# Patient Record
Sex: Male | Born: 1990 | Race: Black or African American | Hispanic: No | Marital: Single | Smoking: Current some day smoker
Health system: Southern US, Community
[De-identification: ages and names within clinical notes are randomized; demographics above are authoritative.]

## PROBLEM LIST (undated history)

## (undated) DIAGNOSIS — F12188 Cannabis abuse with other cannabis-induced disorder: Secondary | ICD-10-CM

## (undated) DIAGNOSIS — F1210 Cannabis abuse, uncomplicated: Secondary | ICD-10-CM

## (undated) DIAGNOSIS — J45909 Unspecified asthma, uncomplicated: Secondary | ICD-10-CM

## (undated) DIAGNOSIS — W3400XA Accidental discharge from unspecified firearms or gun, initial encounter: Secondary | ICD-10-CM

## (undated) DIAGNOSIS — Y249XXA Unspecified firearm discharge, undetermined intent, initial encounter: Secondary | ICD-10-CM

---
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
## 2008-01-19 ENCOUNTER — Emergency Department (HOSPITAL_COMMUNITY): Admission: EM | Admit: 2008-01-19 | Discharge: 2008-01-19 | Payer: Self-pay | Admitting: Emergency Medicine

## 2008-10-21 ENCOUNTER — Emergency Department (HOSPITAL_COMMUNITY): Admission: EM | Admit: 2008-10-21 | Discharge: 2008-10-21 | Payer: Self-pay | Admitting: Emergency Medicine

## 2010-01-23 ENCOUNTER — Emergency Department (HOSPITAL_COMMUNITY): Admission: EM | Admit: 2010-01-23 | Discharge: 2010-01-23 | Payer: Self-pay | Admitting: Emergency Medicine

## 2010-05-05 ENCOUNTER — Emergency Department (HOSPITAL_COMMUNITY): Admission: EM | Admit: 2010-05-05 | Discharge: 2010-05-05 | Payer: Self-pay | Admitting: Emergency Medicine

## 2011-01-26 LAB — GC/CHLAMYDIA PROBE AMP, GENITAL: Chlamydia, DNA Probe: NEGATIVE

## 2011-02-03 LAB — GC/CHLAMYDIA PROBE AMP, GENITAL: Chlamydia, DNA Probe: POSITIVE — AB

## 2011-08-15 LAB — GC/CHLAMYDIA PROBE AMP, GENITAL
Chlamydia, DNA Probe: NEGATIVE
GC Probe Amp, Genital: NEGATIVE

## 2011-08-15 LAB — RPR: RPR Ser Ql: NONREACTIVE

## 2016-10-17 ENCOUNTER — Emergency Department (HOSPITAL_COMMUNITY)
Admission: EM | Admit: 2016-10-17 | Discharge: 2016-10-17 | Disposition: A | Discharge status: Home / Self Care | Payer: Self-pay | Attending: Emergency Medicine | Admitting: Emergency Medicine

## 2016-10-17 ENCOUNTER — Emergency Department (HOSPITAL_COMMUNITY): Payer: Self-pay

## 2016-10-17 ENCOUNTER — Encounter (HOSPITAL_COMMUNITY): Payer: Self-pay

## 2016-10-17 DIAGNOSIS — S41002A Unspecified open wound of left shoulder, initial encounter: Secondary | ICD-10-CM | POA: Insufficient documentation

## 2016-10-17 DIAGNOSIS — J45909 Unspecified asthma, uncomplicated: Secondary | ICD-10-CM | POA: Insufficient documentation

## 2016-10-17 DIAGNOSIS — Y939 Activity, unspecified: Secondary | ICD-10-CM | POA: Insufficient documentation

## 2016-10-17 DIAGNOSIS — F17210 Nicotine dependence, cigarettes, uncomplicated: Secondary | ICD-10-CM | POA: Insufficient documentation

## 2016-10-17 DIAGNOSIS — Y999 Unspecified external cause status: Secondary | ICD-10-CM | POA: Insufficient documentation

## 2016-10-17 DIAGNOSIS — Y929 Unspecified place or not applicable: Secondary | ICD-10-CM | POA: Insufficient documentation

## 2016-10-17 DIAGNOSIS — W3400XA Accidental discharge from unspecified firearms or gun, initial encounter: Secondary | ICD-10-CM | POA: Insufficient documentation

## 2016-10-17 HISTORY — DX: Unspecified asthma, uncomplicated: J45.909

## 2016-10-17 LAB — PREPARE FRESH FROZEN PLASMA
BLOOD PRODUCT EXPIRATION DATE: 201712122359
Blood Product Expiration Date: 201712122359
ISSUE DATE / TIME: 201712081041
ISSUE DATE / TIME: 201712081041
UNIT TYPE AND RH: 6200
Unit Type and Rh: 6200

## 2016-10-17 MED ORDER — HYDROCODONE-ACETAMINOPHEN 5-325 MG PO TABS
1.0000 | ORAL_TABLET | Freq: Once | ORAL | Status: AC
Start: 2016-10-17 — End: 2016-10-17
  Administered 2016-10-17: 1 via ORAL
  Filled 2016-10-17: qty 1

## 2016-10-17 MED ORDER — CEPHALEXIN 500 MG PO CAPS: 500.0000 mg | ORAL_CAPSULE | Freq: Three times a day (TID) | ORAL | 0 refills | Status: DC

## 2016-10-17 MED ORDER — CEPHALEXIN 250 MG PO CAPS
500.0000 mg | ORAL_CAPSULE | Freq: Once | ORAL | Status: AC
  Administered 2016-10-17: 500 mg via ORAL
  Filled 2016-10-17: qty 2

## 2016-10-17 NOTE — Discharge Instructions (Signed)
Change bandage daily with wet to dry dressing. Wear the sling as needed for comfort. Take the antibiotic as prescribed. Call Dr.Xu's office today or Monday, 10/20/2016 to schedule an appointment for one week from today. Tell office staff that Dr.Maverick Dieudonne spoke with Dr.Xu about your case
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
## 2016-10-17 NOTE — Progress Notes (Signed)
Orthopedic Tech Progress Note Patient Details:  Oneida AlarDeionta M XXXBailey 01/18/1991 [id number]  Ortho Devices Type of Ortho Device: Sling immobilizer   Saul FordyceJennifer C Bobby Barton 10/17/2016, 11:31 AM
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
## 2019-06-01 ENCOUNTER — Emergency Department (HOSPITAL_BASED_OUTPATIENT_CLINIC_OR_DEPARTMENT_OTHER): Payer: Self-pay

## 2019-06-01 ENCOUNTER — Other Ambulatory Visit: Payer: Self-pay

## 2019-06-01 ENCOUNTER — Encounter (HOSPITAL_BASED_OUTPATIENT_CLINIC_OR_DEPARTMENT_OTHER): Payer: Self-pay | Admitting: Emergency Medicine

## 2019-06-01 ENCOUNTER — Emergency Department (HOSPITAL_BASED_OUTPATIENT_CLINIC_OR_DEPARTMENT_OTHER)
Admission: EM | Admit: 2019-06-01 | Discharge: 2019-06-01 | Disposition: A | Discharge status: Home / Self Care | Payer: Self-pay | Attending: Emergency Medicine | Admitting: Emergency Medicine

## 2019-06-01 DIAGNOSIS — T407X1A Poisoning by cannabis (derivatives), accidental (unintentional), initial encounter: Secondary | ICD-10-CM | POA: Insufficient documentation

## 2019-06-01 DIAGNOSIS — J45909 Unspecified asthma, uncomplicated: Secondary | ICD-10-CM | POA: Insufficient documentation

## 2019-06-01 DIAGNOSIS — R112 Nausea with vomiting, unspecified: Secondary | ICD-10-CM | POA: Insufficient documentation

## 2019-06-01 DIAGNOSIS — F12188 Cannabis abuse with other cannabis-induced disorder: Secondary | ICD-10-CM

## 2019-06-01 DIAGNOSIS — F17210 Nicotine dependence, cigarettes, uncomplicated: Secondary | ICD-10-CM | POA: Insufficient documentation

## 2019-06-01 LAB — COMPREHENSIVE METABOLIC PANEL
ALT: 20 U/L (ref 0–44)
AST: 18 U/L (ref 15–41)
Albumin: 4.8 g/dL (ref 3.5–5.0)
Alkaline Phosphatase: 41 U/L (ref 38–126)
Anion gap: 13 (ref 5–15)
BUN: 18 mg/dL (ref 6–20)
CO2: 25 mmol/L (ref 22–32)
Calcium: 10.1 mg/dL (ref 8.9–10.3)
Chloride: 100 mmol/L (ref 98–111)
Creatinine, Ser: 1.22 mg/dL (ref 0.61–1.24)
GFR calc Af Amer: >60 mL/min (ref >60)
GFR calc non Af Amer: >60 mL/min (ref >60)
Glucose, Bld: 123 mg/dL — ABNORMAL HIGH (ref 70–99)
Potassium: 3.6 mmol/L (ref 3.5–5.1)
Sodium: 138 mmol/L (ref 135–145)
Total Bilirubin: 1.4 mg/dL — ABNORMAL HIGH (ref 0.3–1.2)
Total Protein: 8.5 g/dL — ABNORMAL HIGH (ref 6.5–8.1)

## 2019-06-01 MED ORDER — SODIUM CHLORIDE 0.9 % IV BOLUS
500.0000 mL | Freq: Once | INTRAVENOUS | Status: AC
  Administered 2019-06-01: 06:00:00 500 mL via INTRAVENOUS

## 2019-06-01 MED ORDER — ONDANSETRON 8 MG PO TBDP: ORAL_TABLET | ORAL | 0 refills | Status: DC

## 2019-06-01 MED ORDER — KETOROLAC TROMETHAMINE 30 MG/ML IJ SOLN
15.0000 mg | Freq: Once | INTRAMUSCULAR | Status: AC
Start: 2019-06-01 — End: 2019-06-01
  Administered 2019-06-01: 15 mg via INTRAVENOUS
  Filled 2019-06-01: qty 1

## 2019-06-01 MED ORDER — HALOPERIDOL LACTATE 5 MG/ML IJ SOLN
5.0000 mg | Freq: Once | INTRAMUSCULAR | Status: AC
  Administered 2019-06-01: 5 mg via INTRAVENOUS
  Filled 2019-06-01: qty 1

## 2019-06-01 MED ORDER — ALUM & MAG HYDROXIDE-SIMETH 200-200-20 MG/5ML PO SUSP
30.0000 mL | Freq: Once | ORAL | Status: AC
  Administered 2019-06-01: 07:00:00 30 mL via ORAL
  Filled 2019-06-01: qty 30
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
## 2019-06-01 NOTE — ED Triage Notes (Signed)
Patient arrived via POV c/o nausea and vomiting x 3 days. Patient states he also feels bloated with no bowel movement since Monday. Patient is AO x 4, normal gait, VS WDL.

## 2019-06-08 ENCOUNTER — Emergency Department (HOSPITAL_BASED_OUTPATIENT_CLINIC_OR_DEPARTMENT_OTHER): Payer: Self-pay

## 2019-06-08 ENCOUNTER — Other Ambulatory Visit: Payer: Self-pay

## 2019-06-08 ENCOUNTER — Emergency Department (HOSPITAL_BASED_OUTPATIENT_CLINIC_OR_DEPARTMENT_OTHER)
Admission: EM | Admit: 2019-06-08 | Discharge: 2019-06-09 | Disposition: A | Discharge status: Home / Self Care | Payer: Self-pay | Attending: Emergency Medicine | Admitting: Emergency Medicine

## 2019-06-08 ENCOUNTER — Encounter (HOSPITAL_BASED_OUTPATIENT_CLINIC_OR_DEPARTMENT_OTHER): Payer: Self-pay

## 2019-06-08 DIAGNOSIS — F17210 Nicotine dependence, cigarettes, uncomplicated: Secondary | ICD-10-CM | POA: Insufficient documentation

## 2019-06-08 DIAGNOSIS — R140 Abdominal distension (gaseous): Secondary | ICD-10-CM | POA: Insufficient documentation

## 2019-06-08 DIAGNOSIS — J45909 Unspecified asthma, uncomplicated: Secondary | ICD-10-CM | POA: Insufficient documentation

## 2019-06-08 DIAGNOSIS — K5900 Constipation, unspecified: Secondary | ICD-10-CM | POA: Insufficient documentation

## 2019-06-08 DIAGNOSIS — K2970 Gastritis, unspecified, without bleeding: Secondary | ICD-10-CM | POA: Insufficient documentation

## 2019-06-08 DIAGNOSIS — K2900 Acute gastritis without bleeding: Secondary | ICD-10-CM

## 2019-06-08 DIAGNOSIS — R112 Nausea with vomiting, unspecified: Secondary | ICD-10-CM | POA: Insufficient documentation

## 2019-06-08 HISTORY — DX: Unspecified firearm discharge, undetermined intent, initial encounter: Y24.9XXA

## 2019-06-08 HISTORY — DX: Cannabis abuse, uncomplicated: F12.10

## 2019-06-08 HISTORY — DX: Cannabis abuse with other cannabis-induced disorder: F12.188

## 2019-06-08 HISTORY — DX: Accidental discharge from unspecified firearms or gun, initial encounter: W34.00XA

## 2019-06-08 LAB — CBC WITH DIFFERENTIAL/PLATELET
Abs Immature Granulocytes: 0.06 10*3/uL (ref 0.00–0.07)
Basophils Absolute: 0.1 10*3/uL (ref 0.0–0.1)
Basophils Relative: 0 %
Eosinophils Absolute: 0 10*3/uL (ref 0.0–0.5)
Eosinophils Relative: 0 %
HCT: 50.6 % (ref 39.0–52.0)
Hemoglobin: 17.4 g/dL — ABNORMAL HIGH (ref 13.0–17.0)
Immature Granulocytes: 1 %
Lymphocytes Relative: 25 %
Lymphs Abs: 3.3 10*3/uL (ref 0.7–4.0)
MCH: 31 pg (ref 26.0–34.0)
MCHC: 34.4 g/dL (ref 30.0–36.0)
MCV: 90 fL (ref 80.0–100.0)
Monocytes Absolute: 1.2 10*3/uL — ABNORMAL HIGH (ref 0.1–1.0)
Monocytes Relative: 9 %
Neutro Abs: 8.3 10*3/uL — ABNORMAL HIGH (ref 1.7–7.7)
Neutrophils Relative %: 65 %
Platelets: 443 10*3/uL — ABNORMAL HIGH (ref 150–400)
RBC: 5.62 MIL/uL (ref 4.22–5.81)
RDW: 12.3 % (ref 11.5–15.5)
WBC: 12.9 10*3/uL — ABNORMAL HIGH (ref 4.0–10.5)
nRBC: 0 % (ref 0.0–0.2)

## 2019-06-08 LAB — COMPREHENSIVE METABOLIC PANEL
ALT: 21 U/L (ref 0–44)
AST: 19 U/L (ref 15–41)
Albumin: 4.9 g/dL (ref 3.5–5.0)
Alkaline Phosphatase: 41 U/L (ref 38–126)
Anion gap: 13 (ref 5–15)
BUN: 11 mg/dL (ref 6–20)
CO2: 25 mmol/L (ref 22–32)
Calcium: 10.1 mg/dL (ref 8.9–10.3)
Chloride: 98 mmol/L (ref 98–111)
Creatinine, Ser: 1.04 mg/dL (ref 0.61–1.24)
GFR calc Af Amer: >60 mL/min (ref >60)
GFR calc non Af Amer: >60 mL/min (ref >60)
Glucose, Bld: 101 mg/dL — ABNORMAL HIGH (ref 70–99)
Potassium: 3.9 mmol/L (ref 3.5–5.1)
Sodium: 136 mmol/L (ref 135–145)
Total Bilirubin: 0.9 mg/dL (ref 0.3–1.2)
Total Protein: 8.4 g/dL — ABNORMAL HIGH (ref 6.5–8.1)

## 2019-06-08 LAB — RAPID URINE DRUG SCREEN, HOSP PERFORMED
Amphetamines: NOT DETECTED
Barbiturates: NOT DETECTED
Benzodiazepines: NOT DETECTED
Cocaine: NOT DETECTED
Opiates: NOT DETECTED
Tetrahydrocannabinol: POSITIVE — AB

## 2019-06-08 LAB — URINALYSIS, MICROSCOPIC (REFLEX)

## 2019-06-08 LAB — LIPASE, BLOOD: Lipase: 32 U/L (ref 11–51)

## 2019-06-08 LAB — URINALYSIS, ROUTINE W REFLEX MICROSCOPIC
Glucose, UA: NEGATIVE mg/dL
Ketones, ur: >80 mg/dL — AB
Nitrite: NEGATIVE
Protein, ur: 30 mg/dL — AB
Specific Gravity, Urine: 1.025 (ref 1.005–1.030)
pH: 6.5 (ref 5.0–8.0)

## 2019-06-08 MED ORDER — DIPHENHYDRAMINE HCL 50 MG/ML IJ SOLN
25.0000 mg | Freq: Once | INTRAMUSCULAR | Status: AC
  Administered 2019-06-08: 25 mg via INTRAVENOUS
  Filled 2019-06-08: qty 1

## 2019-06-08 MED ORDER — METOCLOPRAMIDE HCL 5 MG/ML IJ SOLN
10.0000 mg | Freq: Once | INTRAMUSCULAR | Status: AC
  Administered 2019-06-08: 10 mg via INTRAVENOUS
  Filled 2019-06-08: qty 2

## 2019-06-08 MED ORDER — IOHEXOL 300 MG/ML  SOLN
100.0000 mL | Freq: Once | INTRAMUSCULAR | Status: AC | PRN
  Administered 2019-06-08: 100 mL via INTRAVENOUS

## 2019-06-08 MED ORDER — SODIUM CHLORIDE 0.9 % IV BOLUS
1000.0000 mL | Freq: Once | INTRAVENOUS | Status: AC
  Administered 2019-06-08: 1000 mL via INTRAVENOUS

## 2019-06-08 NOTE — ED Provider Notes (Signed)
[facility] Provider Note: Dominic Walker Dominic Odwyer, MD, FACEP  CSN: [id number] MRN: [id number] ARRIVAL: 06/08/19 at 2050 ROOM: [facility]10/[facility]10   CHIEF COMPLAINT  Vomiting   HISTORY OF PRESENT ILLNESS  06/08/19 10:57 PM Dominic Walker Frutiger is a 28 y.o. male with a 2-week history of persistent, intermittent vomiting.  He was seen on the 22nd of this month with an unremarkable work-up that included a CMET, EKG and an acute abdominal series.  His symptoms have persisted.  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  He denies associated abdominal pain but does feel bloated.  He has had fewer bowel movements than usual, having only 3 or 4 in the past 2 weeks rather than his usual 2/day.  His stools have been harder than usual but he denies rectal pain.  He denies fever, cough or shortness of breath.  He has had hiccups since this afternoon.   Past Medical History:  Diagnosis Date   Asthma     History reviewed. No pertinent surgical history.  No family history on file.  Social History   Tobacco Use   Smoking status: Current Some Day Smoker    Types: Cigarettes   Smokeless tobacco: Never Used  Substance Use Topics   Alcohol use: Yes    Comment: occ   Drug use: Yes    Frequency: 7.0 times per week    Types: Marijuana    Prior to Admission medications   Medication Sig Start Date End Date Taking? Authorizing Provider  metoCLOPramide (REGLAN) 10 MG tablet Take 1 tablet (10 mg total) by mouth every 6 (six) hours as needed (nausea, vomting or hiccups). 06/09/19   Conner Muegge, MD  omeprazole (PRILOSEC) 20 MG capsule Take 1 capsule (20 mg total) by mouth daily. 06/09/19   Janeka Libman, MD  ondansetron (ZOFRAN ODT) 8 MG disintegrating tablet 8mg  ODT q8 hours prn nausea 06/01/19   Palumbo, April, MD  polyethylene glycol (MIRALAX / GLYCOLAX) 17 g packet Take 17 g by mouth daily as needed (constipation). Pharmacist: May dispense convenient trade size. 06/09/19   Hisae Decoursey, MD     Allergies Patient has no known allergies.   REVIEW OF SYSTEMS  Negative except as noted here or in the History of Present Illness.   PHYSICAL EXAMINATION  Initial Vital Signs Blood pressure (!) 149/81, pulse 83, temperature 99.1 F (37.3 C), temperature source Oral, resp. rate 18, weight 77.6 kg, SpO2 100 %.  Examination General: Well-developed, well-nourished male in no acute distress; appearance consistent with age of record HENT: normocephalic; atraumatic Eyes: pupils equal, round and reactive to light; extraocular muscles intact Neck: supple Heart: regular rate and rhythm Lungs: clear to auscultation bilaterally; hiccups Abdomen: soft; nondistended; nontender; no masses or hepatosplenomegaly; bowel sounds present Extremities: No deformity; full range of motion; pulses normal Neurologic: Awake, alert and oriented; motor function intact in all extremities and symmetric; no facial droop Skin: Warm and dry Psychiatric: Normal mood and affect   RESULTS  Summary of this visit's results, reviewed by myself:   EKG Interpretation  Date/Time:    Ventricular Rate:    PR Interval:    QRS Duration:   QT Interval:    QTC Calculation:   R Axis:     Text Interpretation:        Laboratory Studies: Results for orders placed or performed during the hospital encounter of 06/08/19 (from the past 24 hour(s))  Urinalysis, Routine w reflex microscopic     Status: Abnormal   Collection  Time: 06/08/19 10:55 PM  Result Value Ref Range   Color, Urine YELLOW YELLOW   APPearance HAZY (A) CLEAR   Specific Gravity, Urine 1.025 1.005 - 1.030   pH 6.5 5.0 - 8.0   Glucose, UA NEGATIVE NEGATIVE mg/dL   Hgb urine dipstick TRACE (A) NEGATIVE   Bilirubin Urine SMALL (A) NEGATIVE   Ketones, ur >80 (A) NEGATIVE mg/dL   Protein, ur 30 (A) NEGATIVE mg/dL   Nitrite NEGATIVE NEGATIVE   Leukocytes,Ua TRACE (A) NEGATIVE  CBC with Differential/Platelet     Status: Abnormal   Collection Time:  06/08/19 10:55 PM  Result Value Ref Range   WBC 12.9 (H) 4.0 - 10.5 K/uL   RBC 5.62 4.22 - 5.81 MIL/uL   Hemoglobin 17.4 (H) 13.0 - 17.0 g/dL   HCT 50.6 39.0 - 52.0 %   MCV 90.0 80.0 - 100.0 fL   MCH 31.0 26.0 - 34.0 pg   MCHC 34.4 30.0 - 36.0 g/dL   RDW 12.3 11.5 - 15.5 %   Platelets 443 (H) 150 - 400 K/uL   nRBC 0.0 0.0 - 0.2 %   Neutrophils Relative % 65 %   Neutro Abs 8.3 (H) 1.7 - 7.7 K/uL   Lymphocytes Relative 25 %   Lymphs Abs 3.3 0.7 - 4.0 K/uL   Monocytes Relative 9 %   Monocytes Absolute 1.2 (H) 0.1 - 1.0 K/uL   Eosinophils Relative 0 %   Eosinophils Absolute 0.0 0.0 - 0.5 K/uL   Basophils Relative 0 %   Basophils Absolute 0.1 0.0 - 0.1 K/uL   Immature Granulocytes 1 %   Abs Immature Granulocytes 0.06 0.00 - 0.07 K/uL  Lipase, blood     Status: None   Collection Time: 06/08/19 10:55 PM  Result Value Ref Range   Lipase 32 11 - 51 U/L  Rapid urine drug screen (hospital performed)     Status: Abnormal   Collection Time: 06/08/19 10:55 PM  Result Value Ref Range   Opiates NONE DETECTED NONE DETECTED   Cocaine NONE DETECTED NONE DETECTED   Benzodiazepines NONE DETECTED NONE DETECTED   Amphetamines NONE DETECTED NONE DETECTED   Tetrahydrocannabinol POSITIVE (A) NONE DETECTED   Barbiturates NONE DETECTED NONE DETECTED  Urinalysis, Microscopic (reflex)     Status: Abnormal   Collection Time: 06/08/19 10:55 PM  Result Value Ref Range   RBC / HPF 0-5 0 - 5 RBC/hpf   WBC, UA 0-5 0 - 5 WBC/hpf   Bacteria, UA FEW (A) NONE SEEN   Squamous Epithelial / LPF 0-5 0 - 5   Mucus PRESENT   Comprehensive metabolic panel     Status: Abnormal   Collection Time: 06/08/19 10:55 PM  Result Value Ref Range   Sodium 136 135 - 145 mmol/L   Potassium 3.9 3.5 - 5.1 mmol/L   Chloride 98 98 - 111 mmol/L   CO2 25 22 - 32 mmol/L   Glucose, Bld 101 (H) 70 - 99 mg/dL   BUN 11 6 - 20 mg/dL   Creatinine, Ser 1.04 0.61 - 1.24 mg/dL   Calcium 10.1 8.9 - 10.3 mg/dL   Total Protein 8.4 (H) 6.5  - 8.1 g/dL   Albumin 4.9 3.5 - 5.0 g/dL   AST 19 15 - 41 U/L   ALT 21 0 - 44 U/L   Alkaline Phosphatase 41 38 - 126 U/L   Total Bilirubin 0.9 0.3 - 1.2 mg/dL   GFR calc non Af Amer >60 >60 mL/min  GFR calc Af Amer >60 >60 mL/min   Anion gap 13 5 - 15   Imaging Studies: Ct Abdomen Pelvis W Contrast  Result Date: 06/08/2019 CLINICAL DATA:  Vomiting and constipation for 2 weeks EXAM: CT ABDOMEN AND PELVIS WITH CONTRAST TECHNIQUE: Multidetector CT imaging of the abdomen and pelvis was performed using the standard protocol following bolus administration of intravenous contrast. CONTRAST:  100mL OMNIPAQUE IOHEXOL 300 MG/ML  SOLN COMPARISON:  Radiograph June 01, 2019 FINDINGS: Lower chest: The visualized heart size within normal limits. No pericardial fluid/thickening. No hiatal hernia. The visualized portions of the lungs are clear. Hepatobiliary: The liver is normal in density without focal abnormality.The main portal vein is patent. No evidence of calcified gallstones, gallbladder wall thickening or biliary dilatation. Pancreas: Unremarkable. No pancreatic ductal dilatation or surrounding inflammatory changes. Spleen: Normal in size without focal abnormality. Adrenals/Urinary Tract: Both adrenal glands appear normal. The kidneys and collecting system appear normal without evidence of urinary tract calculus or hydronephrosis. Bladder is unremarkable. Stomach/Bowel: There is mild stomach wall thickening seen within the distal gastroduodenal junction mid seen on series 2, image 29. No significant surrounding fat stranding changes are seen. No loculated fluid collections. There is a moderate amount stool without evidence of obstruction. The remainder of the small bowel is normal in appearance. Appendix is normal. Vascular/Lymphatic: There are no enlarged mesenteric, retroperitoneal, or pelvic lymph nodes. No significant vascular findings are present. Reproductive: The prostate is unremarkable. Other: No  evidence of abdominal wall mass or hernia. Musculoskeletal: No acute or significant osseous findings. IMPRESSION: 1. Nonspecific apparent stomach wall thickening seen at the gastroduodenal junction. This could be due to gastritis versus underdistention. 2. Moderate amount of colonic stool without evidence of obstruction. Electronically Signed   By: Jonna ClarkBindu  Avutu M.D.   On: 06/08/2019 23:47    ED COURSE and MDM  Nursing notes and initial vitals signs, including pulse oximetry, reviewed.  Vitals:   06/08/19 2107 06/08/19 2108 06/08/19 2251  BP: (!) 134/102  (!) 149/81  Pulse: (!) 114  83  Resp: 18  18  Temp: 99.1 F (37.3 C)    TempSrc: Oral    SpO2: 99%  100%  Weight:  77.6 kg    12:26 AM Relief of nausea with IV Reglan.  Hiccups transiently improved but have recurred.  Patient is now insistent on leaving.  This may be due to akathisia from the Reglan.  Given CT findings suggesting gastritis we will start him on a PPI.  For his constipation we will start him on MiraLAX. For his nausea and hiccups will place him on Reglan.  He was advised to cut down on his marijuana use although his symptomatology is not entirely consistent with cannabis induced hyperemesis syndrome.  PROCEDURES    ED DIAGNOSES     ICD-10-CM   1. Nausea and vomiting in adult  R11.2   2. Acute gastritis without hemorrhage, unspecified gastritis type  K29.00   3. Constipation, unspecified constipation type  K59.00        Zhoe Catania, Jonny RuizJohn, MD 06/09/19 [phone]

## 2019-06-08 NOTE — ED Triage Notes (Signed)
Pt c/o n/v x 2 weeks-seen here for same-also c/o constipation with "little BM today"-NAD-steady gait

## 2019-06-08 NOTE — ED Notes (Signed)
Patient transported to CT 

## 2019-06-09 ENCOUNTER — Encounter (HOSPITAL_BASED_OUTPATIENT_CLINIC_OR_DEPARTMENT_OTHER): Payer: Self-pay | Admitting: Emergency Medicine

## 2019-06-09 MED ORDER — OMEPRAZOLE 20 MG PO CPDR: 20.0000 mg | DELAYED_RELEASE_CAPSULE | Freq: Every day | ORAL | 0 refills | Status: AC

## 2019-06-09 MED ORDER — PANTOPRAZOLE SODIUM 40 MG IV SOLR: 40.0000 mg | Freq: Once | INTRAVENOUS | Status: DC

## 2019-06-09 MED ORDER — METOCLOPRAMIDE HCL 10 MG PO TABS: 10.0000 mg | ORAL_TABLET | Freq: Four times a day (QID) | ORAL | 0 refills | Status: AC | PRN

## 2019-06-09 MED ORDER — POLYETHYLENE GLYCOL 3350 17 G PO PACK: 17.0000 g | PACK | Freq: Every day | ORAL | 0 refills | Status: AC | PRN

## 2019-06-11 ENCOUNTER — Other Ambulatory Visit: Payer: Self-pay

## 2019-06-11 ENCOUNTER — Encounter (HOSPITAL_BASED_OUTPATIENT_CLINIC_OR_DEPARTMENT_OTHER): Payer: Self-pay | Admitting: Emergency Medicine

## 2019-06-11 ENCOUNTER — Emergency Department (HOSPITAL_BASED_OUTPATIENT_CLINIC_OR_DEPARTMENT_OTHER)
Admission: EM | Admit: 2019-06-11 | Discharge: 2019-06-11 | Disposition: A | Discharge status: Home / Self Care | Payer: Self-pay | Attending: Emergency Medicine | Admitting: Emergency Medicine

## 2019-06-11 DIAGNOSIS — R1110 Vomiting, unspecified: Secondary | ICD-10-CM | POA: Insufficient documentation

## 2019-06-11 DIAGNOSIS — J45909 Unspecified asthma, uncomplicated: Secondary | ICD-10-CM | POA: Insufficient documentation

## 2019-06-11 DIAGNOSIS — F17210 Nicotine dependence, cigarettes, uncomplicated: Secondary | ICD-10-CM | POA: Insufficient documentation

## 2019-06-11 LAB — CBC WITH DIFFERENTIAL/PLATELET
Abs Immature Granulocytes: 0.03 10*3/uL (ref 0.00–0.07)
Basophils Absolute: 0.1 10*3/uL (ref 0.0–0.1)
Basophils Relative: 1 %
Eosinophils Absolute: 0 10*3/uL (ref 0.0–0.5)
Eosinophils Relative: 0 %
HCT: 50.8 % (ref 39.0–52.0)
Hemoglobin: 17.6 g/dL — ABNORMAL HIGH (ref 13.0–17.0)
Immature Granulocytes: 0 %
Lymphocytes Relative: 26 %
Lymphs Abs: 2.4 10*3/uL (ref 0.7–4.0)
MCH: 31 pg (ref 26.0–34.0)
MCHC: 34.6 g/dL (ref 30.0–36.0)
MCV: 89.4 fL (ref 80.0–100.0)
Monocytes Absolute: 1 10*3/uL (ref 0.1–1.0)
Monocytes Relative: 10 %
Neutro Abs: 5.7 10*3/uL (ref 1.7–7.7)
Neutrophils Relative %: 63 %
Platelets: 437 10*3/uL — ABNORMAL HIGH (ref 150–400)
RBC: 5.68 MIL/uL (ref 4.22–5.81)
RDW: 11.9 % (ref 11.5–15.5)
WBC: 9.2 10*3/uL (ref 4.0–10.5)
nRBC: 0 % (ref 0.0–0.2)

## 2019-06-11 LAB — COMPREHENSIVE METABOLIC PANEL
ALT: 14 U/L (ref 0–44)
AST: 14 U/L — ABNORMAL LOW (ref 15–41)
Albumin: 4.4 g/dL (ref 3.5–5.0)
Alkaline Phosphatase: 34 U/L — ABNORMAL LOW (ref 38–126)
Anion gap: 12 (ref 5–15)
BUN: 11 mg/dL (ref 6–20)
CO2: 23 mmol/L (ref 22–32)
Calcium: 9 mg/dL (ref 8.9–10.3)
Chloride: 98 mmol/L (ref 98–111)
Creatinine, Ser: 0.98 mg/dL (ref 0.61–1.24)
GFR calc Af Amer: >60 mL/min (ref >60)
GFR calc non Af Amer: >60 mL/min (ref >60)
Glucose, Bld: 93 mg/dL (ref 70–99)
Potassium: 3.9 mmol/L (ref 3.5–5.1)
Sodium: 133 mmol/L — ABNORMAL LOW (ref 135–145)
Total Bilirubin: 1.1 mg/dL (ref 0.3–1.2)
Total Protein: 7.5 g/dL (ref 6.5–8.1)

## 2019-06-11 MED ORDER — SODIUM CHLORIDE 0.9 % IV BOLUS
1000.0000 mL | Freq: Once | INTRAVENOUS | Status: AC
  Administered 2019-06-11: 19:00:00 1000 mL via INTRAVENOUS

## 2019-06-11 MED ORDER — FAMOTIDINE 20 MG PO TABS: 20.0000 mg | ORAL_TABLET | Freq: Two times a day (BID) | ORAL | 0 refills | Status: AC

## 2019-06-11 MED ORDER — ONDANSETRON 4 MG PO TBDP: 4.0000 mg | ORAL_TABLET | Freq: Three times a day (TID) | ORAL | 0 refills | Status: AC | PRN

## 2019-06-11 MED ORDER — ONDANSETRON HCL 4 MG/2ML IJ SOLN
4.0000 mg | Freq: Once | INTRAMUSCULAR | Status: AC
  Administered 2019-06-11: 19:00:00 4 mg via INTRAVENOUS
  Filled 2019-06-11: qty 2
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
## 2019-06-11 NOTE — ED Notes (Signed)
Passed PO challenge

## 2019-06-11 NOTE — ED Triage Notes (Signed)
N/V x 3 weeks. Seen here multiple times.

## 2019-06-11 NOTE — Discharge Instructions (Signed)
If you develop worsening, continued, or recurrent abdominal pain, uncontrolled vomiting, fever, chest or back pain, or any other new/concerning symptoms then return to the ER for evaluation.  

## 2019-06-11 NOTE — ED Notes (Signed)
Pt given ice chips

## 2020-09-09 IMAGING — CT CT ABDOMEN AND PELVIS WITH CONTRAST
2 of 4 series · 15 of 46 positions shown, 17 images · IV contrast (APPLIED)
Comparison: Radiograph June 01, 2019

CLINICAL DATA: Vomiting and constipation for 2 weeks

EXAM:
CT ABDOMEN AND PELVIS WITH CONTRAST
TECHNIQUE: Multidetector CT imaging of the abdomen and pelvis was performed
using the standard protocol following bolus administration of
intravenous contrast.
CONTRAST:  100mL OMNIPAQUE IOHEXOL 300 MG/ML  SOLN

[Series 2: axial st · axial · 0.72mm/px · z∈[+499,+929]mm · 12 of 94 slices shown, 14 images]
[im 4/94  soft-tissue]
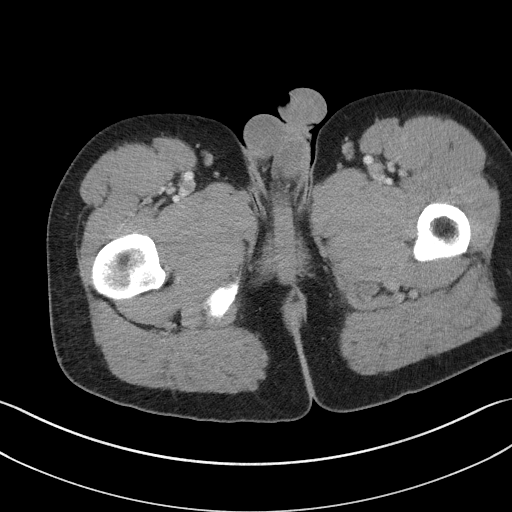
[im 4/94  bone]
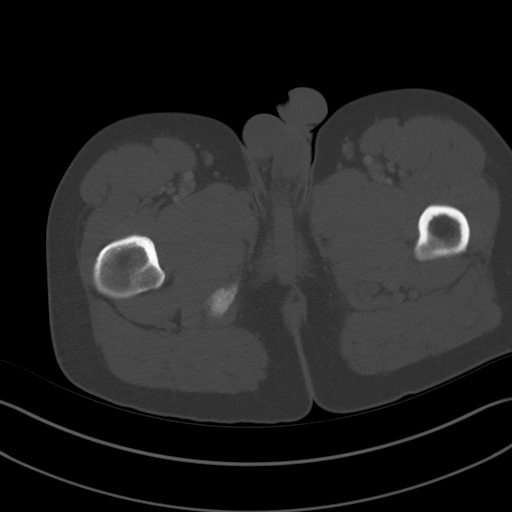
[im 12/94  soft-tissue]
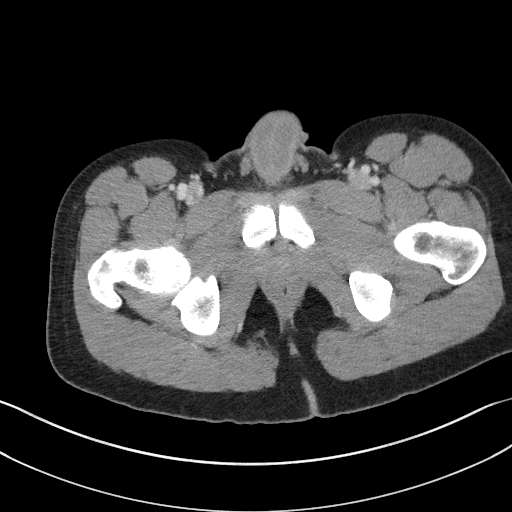
[im 20/94  soft-tissue]
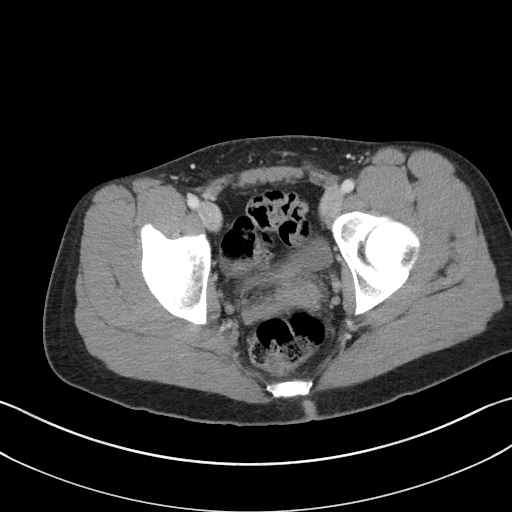
[im 28/94  soft-tissue]
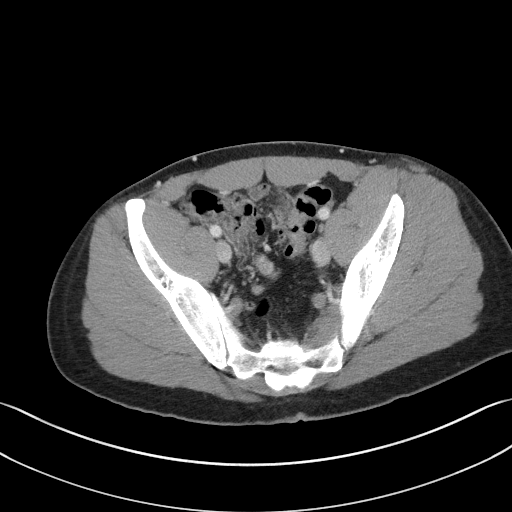
[im 35/94  soft-tissue]
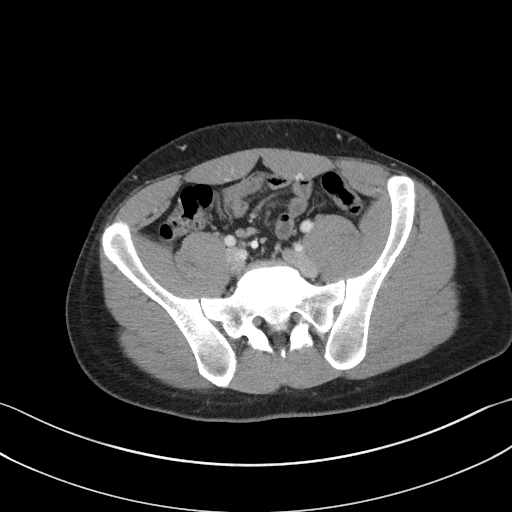
[im 43/94  soft-tissue]
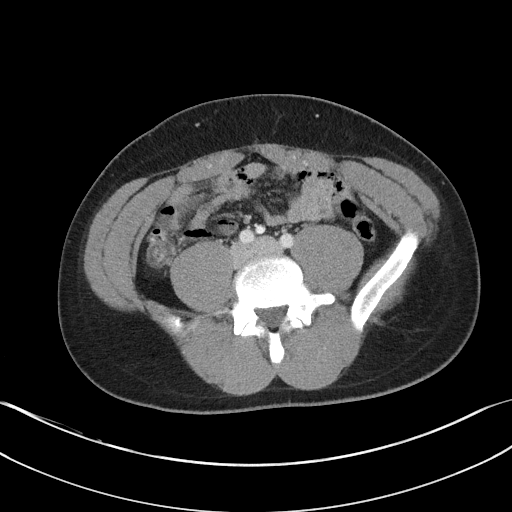
[im 51/94  soft-tissue]
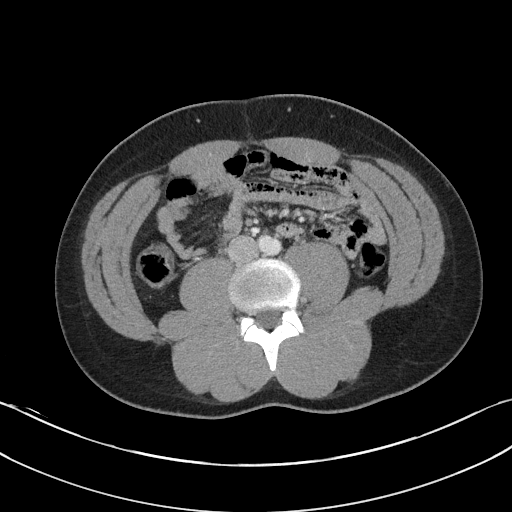
[im 59/94  soft-tissue]
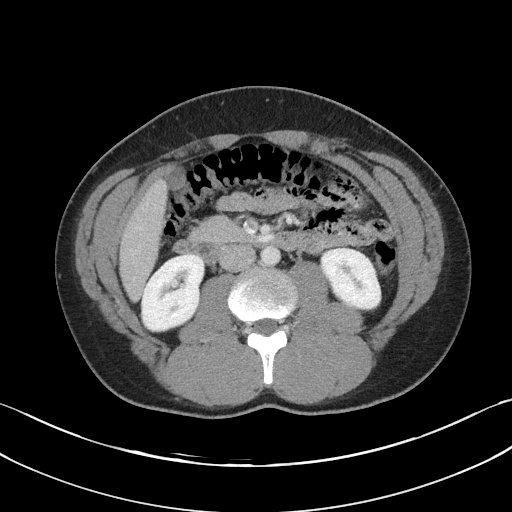
[im 66/94  soft-tissue]
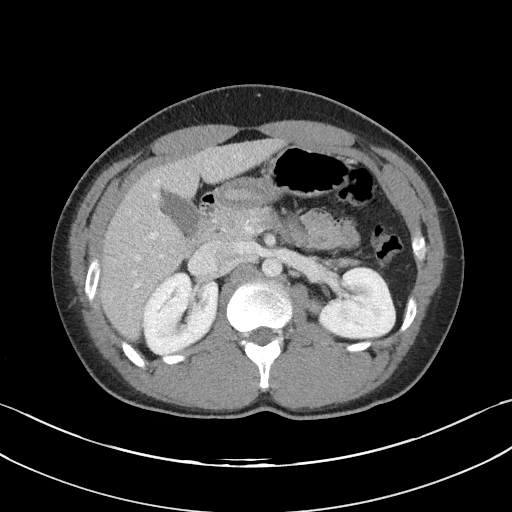
[im 66/94  bone]
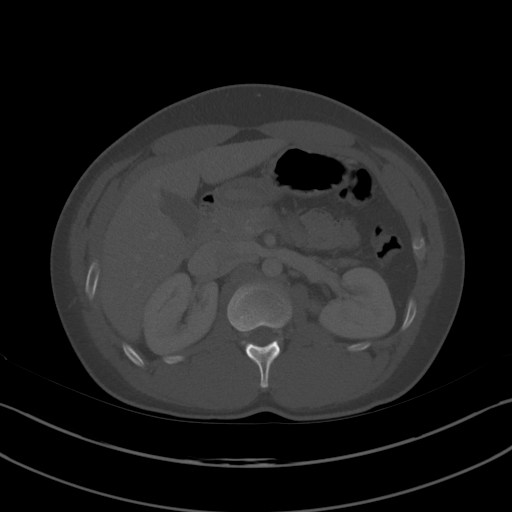
[im 74/94  soft-tissue]
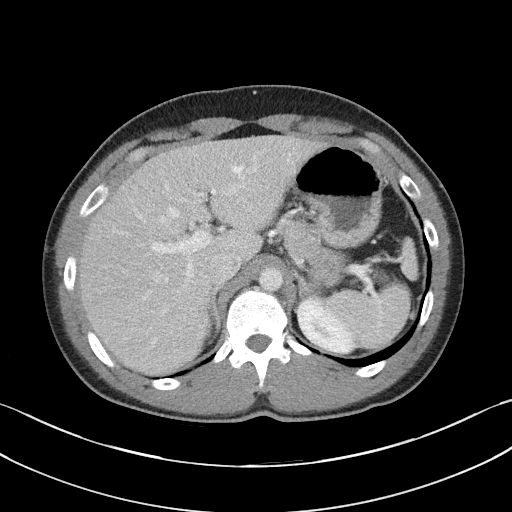
[im 82/94  soft-tissue]
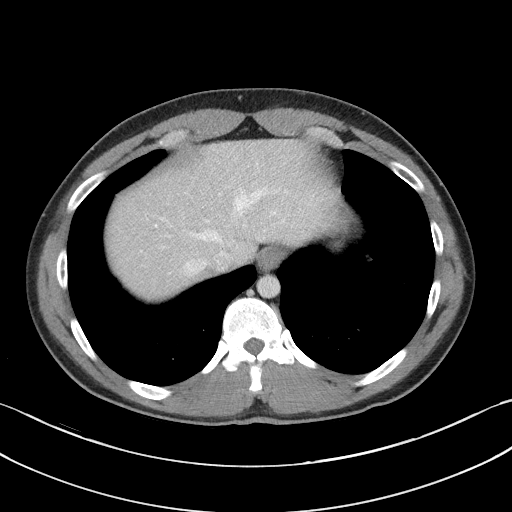
[im 90/94  soft-tissue]
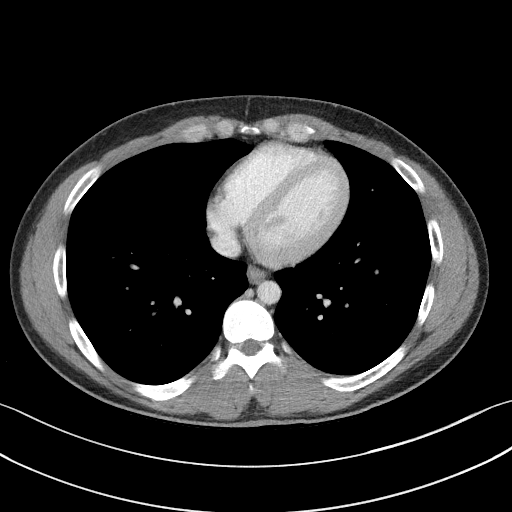

[Series 5: coronal st · coronal · 0.68mm/px · 3 of 95 slices shown]
[im 32/95  soft-tissue]
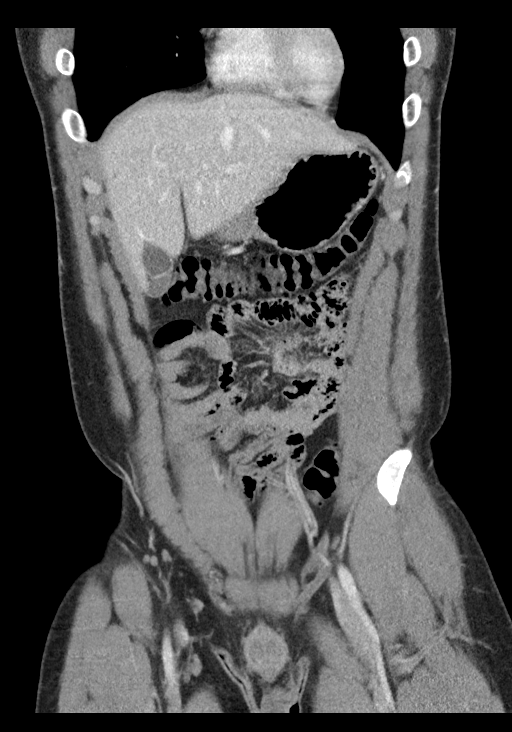
[im 42/95  soft-tissue]
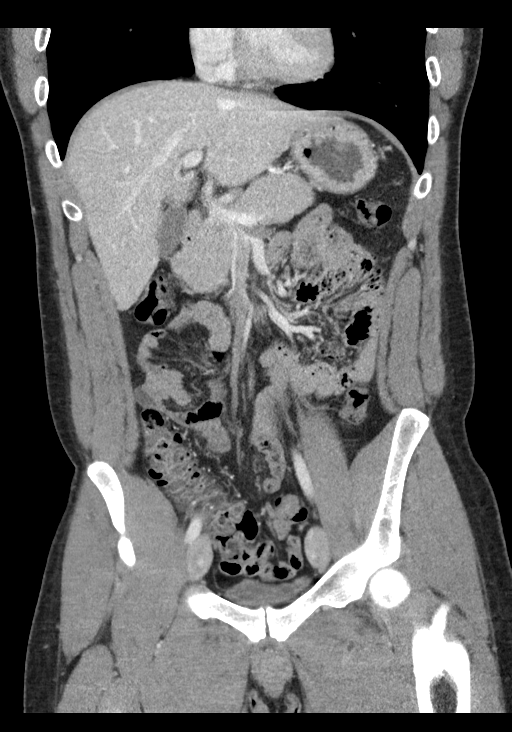
[im 53/95  soft-tissue]
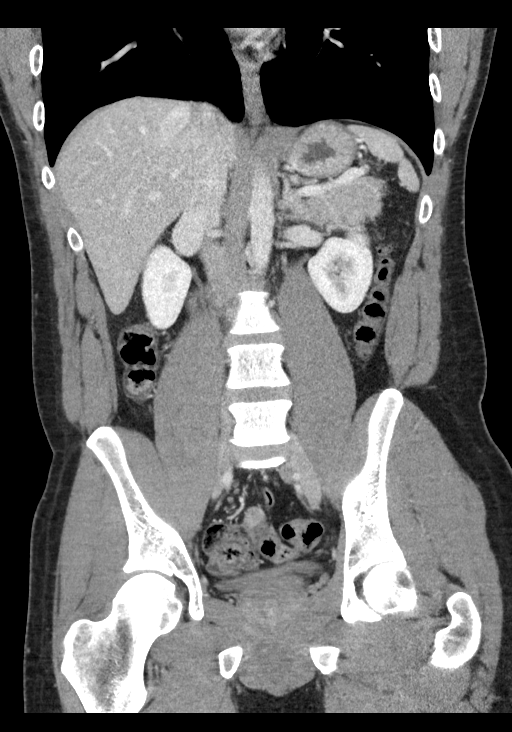

[15 of 46 positions shown; findings below may reference images not displayed]

FINDINGS: Lower chest: The visualized heart size within normal limits. No
pericardial fluid/thickening.

No hiatal hernia.

The visualized portions of the lungs are clear.

Hepatobiliary: The liver is normal in density without focal
abnormality.The main portal vein is patent. No evidence of calcified
gallstones, gallbladder wall thickening or biliary dilatation.

Pancreas: Unremarkable. No pancreatic ductal dilatation or
surrounding inflammatory changes.

Spleen: Normal in size without focal abnormality.

Adrenals/Urinary Tract: Both adrenal glands appear normal. The
kidneys and collecting system appear normal without evidence of
urinary tract calculus or hydronephrosis. Bladder is unremarkable.

Stomach/Bowel: There is mild stomach wall thickening seen within the
distal gastroduodenal junction mid seen on series 2, image 29. No
significant surrounding fat stranding changes are seen. No loculated
fluid collections. There is a moderate amount stool without evidence
of obstruction. The remainder of the small bowel is normal in
appearance. Appendix is normal.

Vascular/Lymphatic: There are no enlarged mesenteric,
retroperitoneal, or pelvic lymph nodes. No significant vascular
findings are present.

Reproductive: The prostate is unremarkable.

Other: No evidence of abdominal wall mass or hernia.

Musculoskeletal: No acute or significant osseous findings.
IMPRESSION: 1. Nonspecific apparent stomach wall thickening seen at the
gastroduodenal junction. This could be due to gastritis versus
underdistention.
2. Moderate amount of colonic stool without evidence of obstruction.
